# Patient Record
Sex: Male | Born: 1960 | Race: White | Hispanic: No | Marital: Married
Health system: Southern US, Community
[De-identification: ages and names within clinical notes are randomized; demographics above are authoritative.]

---

## 1998-03-15 ENCOUNTER — Ambulatory Visit (HOSPITAL_COMMUNITY): Admission: RE | Admit: 1998-03-15 | Discharge: 1998-03-15 | Payer: Self-pay | Admitting: Gynecology

## 2006-06-29 ENCOUNTER — Encounter: Admission: RE | Admit: 2006-06-29 | Discharge: 2006-06-29 | Payer: Self-pay | Admitting: Family Medicine

## 2006-08-24 ENCOUNTER — Encounter: Admission: RE | Admit: 2006-08-24 | Discharge: 2006-08-24 | Payer: Self-pay | Admitting: Family Medicine

## 2009-06-08 IMAGING — CT CT EXTREM LOW W/O CM*R*
2 of 4 series · 5 of 14 positions shown, 6 images · non-contrast
Comparison: CT scan 06/29/06.

CLINICAL DATA: Known navicular fracture. Question healing.
CT OF THE RIGHT ANKLE WITHOUT CONTRAST ? 08/24/06:
TECHNIQUE: Multidetector CT imaging was performed of the right ankle according to standard protocol.  Multiplanar CT image reconstructions were also generated.

[Series 2: bone windows · axial · 0.30mm/px · z∈[+13,+70]mm · 3 of 93 slices shown, 4 images]
[im 24/93  soft-tissue]
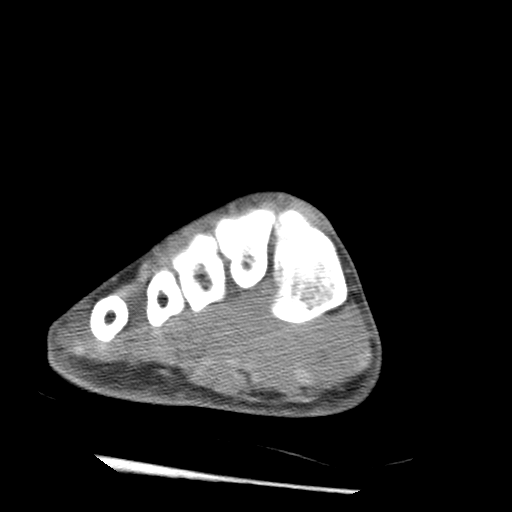
[im 24/93  bone]
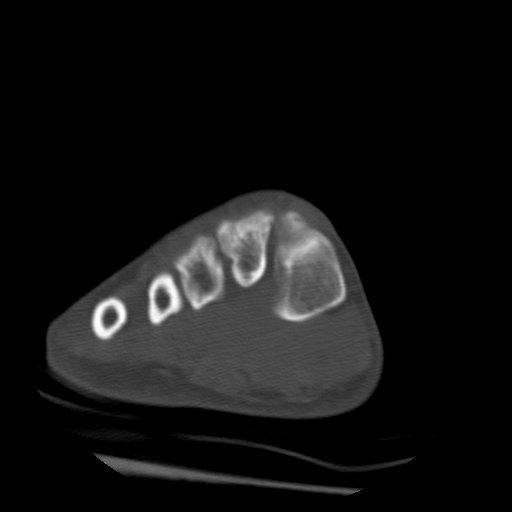
[im 47/93  bone]
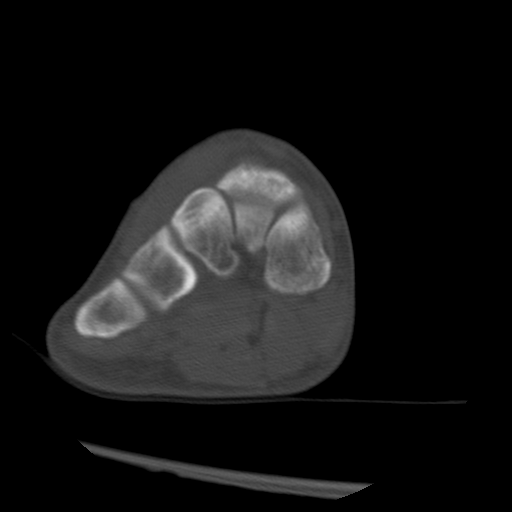
[im 70/93  bone]
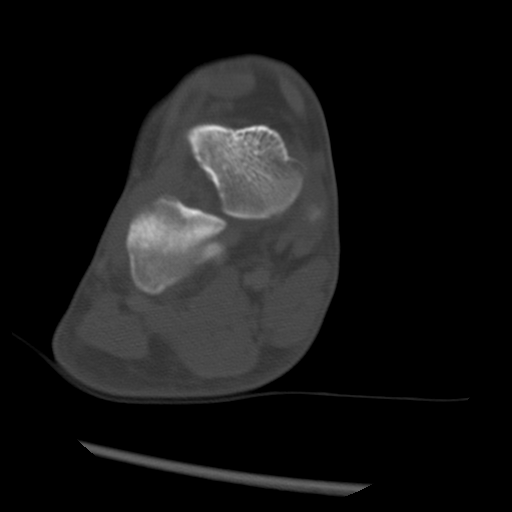

[Series 3: detail windows · axial · 0.30mm/px · z∈[+22,+60]mm · 2 of 93 slices shown]
[im 31/93  bone]
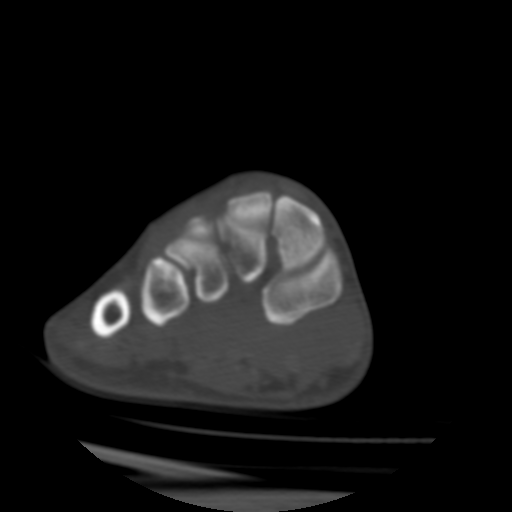
[im 62/93  bone]
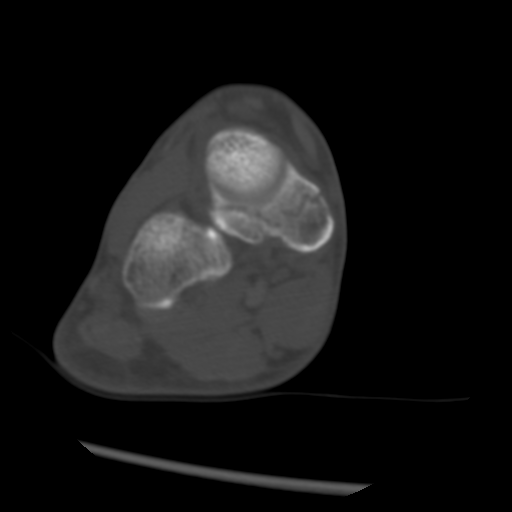

[5 of 14 positions shown; findings below may reference images not displayed]

FINDINGS: Again seen is navicular bone fracture.  Fracture lines are blurring with bridging bone identified centrally within the fracture.  Note is made that at the medial most margin of the fracture no definite bridging bone is seen and there may be nonunion in this location.  The remainder of the fracture appears to be healing.  An area of potential nonunion measures 0.7 cm from medial to lateral and extends along the long axis of the bone. No new fracture is identified. There has been no change in position or alignment.
IMPRESSION: Navicular bone fracture demonstrates healing.  There is a potential small area of nonunion as detailed above.

## 2017-02-24 DIAGNOSIS — M545 Low back pain: Secondary | ICD-10-CM | POA: Diagnosis not present

## 2017-03-14 DIAGNOSIS — Z0000 Encounter for general adult medical examination without abnormal findings: Secondary | ICD-10-CM | POA: Diagnosis not present

## 2017-03-14 DIAGNOSIS — Z6824 Body mass index (BMI) 24.0-24.9, adult: Secondary | ICD-10-CM | POA: Diagnosis not present

## 2017-03-14 DIAGNOSIS — Z125 Encounter for screening for malignant neoplasm of prostate: Secondary | ICD-10-CM | POA: Diagnosis not present

## 2018-04-28 DIAGNOSIS — Z0000 Encounter for general adult medical examination without abnormal findings: Secondary | ICD-10-CM | POA: Diagnosis not present

## 2018-04-28 DIAGNOSIS — Z131 Encounter for screening for diabetes mellitus: Secondary | ICD-10-CM | POA: Diagnosis not present

## 2018-04-28 DIAGNOSIS — Z125 Encounter for screening for malignant neoplasm of prostate: Secondary | ICD-10-CM | POA: Diagnosis not present

## 2019-04-18 ENCOUNTER — Ambulatory Visit: Payer: BC Managed Care – PPO | Attending: Internal Medicine

## 2019-04-18 DIAGNOSIS — Z23 Encounter for immunization: Secondary | ICD-10-CM

## 2019-04-18 NOTE — Progress Notes (Signed)
   Covid-[facility] Vaccination [facility]  Name:  Cory Patterson    MRN: [id number] DOB: 27-Feb-1960  04/18/2019  [name] was observed post Covid-19 immunization for 15 minutes without incident. He was provided with Vaccine Information Sheet and instruction to access the V-Safe system.   [name] was instructed to call 911 with any severe reactions post vaccine: Marland Kitchen Difficulty breathing  . Swelling of face and throat  . A fast heartbeat  . A bad rash all over body  . Dizziness and weakness   Immunizations Administered    Name Date Dose VIS Date Route   Pfizer COVID-19 Vaccine 04/18/2019  9:50 AM 0.3 mL 01/16/2019 Intramuscular   Manufacturer: [employer], [employer]   Lot: [id number]   NDC: [address]-8

## 2019-05-04 DIAGNOSIS — E663 Overweight: Secondary | ICD-10-CM | POA: Diagnosis not present

## 2019-05-04 DIAGNOSIS — E785 Hyperlipidemia, unspecified: Secondary | ICD-10-CM | POA: Diagnosis not present

## 2019-05-04 DIAGNOSIS — Z0000 Encounter for general adult medical examination without abnormal findings: Secondary | ICD-10-CM | POA: Diagnosis not present

## 2019-05-04 DIAGNOSIS — Z125 Encounter for screening for malignant neoplasm of prostate: Secondary | ICD-10-CM | POA: Diagnosis not present

## 2019-05-04 DIAGNOSIS — Z131 Encounter for screening for diabetes mellitus: Secondary | ICD-10-CM | POA: Diagnosis not present

## 2019-05-13 ENCOUNTER — Ambulatory Visit: Payer: BC Managed Care – PPO | Attending: Internal Medicine

## 2019-05-13 ENCOUNTER — Ambulatory Visit: Payer: BC Managed Care – PPO

## 2019-05-13 DIAGNOSIS — Z23 Encounter for immunization: Secondary | ICD-10-CM

## 2019-05-13 NOTE — Progress Notes (Signed)
   Covid-19 Vaccination Clinic  Name:  Cory Patterson    MRN: [id number] DOB: 04/26/1960  05/13/2019  [name] was observed post Covid-19 immunization for 15 minutes without incident. He was provided with Vaccine Information Sheet and instruction to access the V-Safe system.   [name] was instructed to call 911 with any severe reactions post vaccine: Marland Kitchen Difficulty breathing  . Swelling of face and throat  . A fast heartbeat  . A bad rash all over body  . Dizziness and weakness   Immunizations Administered    Name Date Dose VIS Date Route   Pfizer COVID-19 Vaccine 05/13/2019  2:35 PM 0.3 mL 01/16/2019 Intramuscular   Manufacturer: [employer], [employer]   Lot: [id number]   NDC: [address]-2

## 2020-04-04 DIAGNOSIS — M79671 Pain in right foot: Secondary | ICD-10-CM | POA: Diagnosis not present

## 2020-05-05 DIAGNOSIS — Z13220 Encounter for screening for lipoid disorders: Secondary | ICD-10-CM | POA: Diagnosis not present

## 2020-05-05 DIAGNOSIS — E663 Overweight: Secondary | ICD-10-CM | POA: Diagnosis not present

## 2020-05-05 DIAGNOSIS — Z0000 Encounter for general adult medical examination without abnormal findings: Secondary | ICD-10-CM | POA: Diagnosis not present

## 2020-05-05 DIAGNOSIS — Z131 Encounter for screening for diabetes mellitus: Secondary | ICD-10-CM | POA: Diagnosis not present

## 2020-05-05 DIAGNOSIS — Z125 Encounter for screening for malignant neoplasm of prostate: Secondary | ICD-10-CM | POA: Diagnosis not present

## 2020-09-30 DIAGNOSIS — Z86010 Personal history of colonic polyps: Secondary | ICD-10-CM | POA: Diagnosis not present

## 2021-01-06 DIAGNOSIS — Z86010 Personal history of colonic polyps: Secondary | ICD-10-CM | POA: Diagnosis not present

## 2021-01-06 DIAGNOSIS — K5730 Diverticulosis of large intestine without perforation or abscess without bleeding: Secondary | ICD-10-CM | POA: Diagnosis not present

## 2021-06-09 DIAGNOSIS — Z125 Encounter for screening for malignant neoplasm of prostate: Secondary | ICD-10-CM | POA: Diagnosis not present

## 2021-06-09 DIAGNOSIS — Z13220 Encounter for screening for lipoid disorders: Secondary | ICD-10-CM | POA: Diagnosis not present

## 2021-06-09 DIAGNOSIS — N509 Disorder of male genital organs, unspecified: Secondary | ICD-10-CM | POA: Diagnosis not present

## 2021-06-09 DIAGNOSIS — Z0000 Encounter for general adult medical examination without abnormal findings: Secondary | ICD-10-CM | POA: Diagnosis not present

## 2021-06-23 ENCOUNTER — Other Ambulatory Visit: Payer: Self-pay | Admitting: Family Medicine

## 2021-06-23 DIAGNOSIS — N5089 Other specified disorders of the male genital organs: Secondary | ICD-10-CM

## 2021-07-17 ENCOUNTER — Ambulatory Visit
Admission: RE | Admit: 2021-07-17 | Discharge: 2021-07-17 | Disposition: A | Discharge status: Home / Self Care | Payer: BC Managed Care – PPO | Source: Ambulatory Visit | Attending: Family Medicine | Admitting: Family Medicine

## 2021-07-17 DIAGNOSIS — N5089 Other specified disorders of the male genital organs: Secondary | ICD-10-CM

## 2021-07-17 DIAGNOSIS — I861 Scrotal varices: Secondary | ICD-10-CM | POA: Diagnosis not present

## 2021-07-17 DIAGNOSIS — N503 Cyst of epididymis: Secondary | ICD-10-CM | POA: Diagnosis not present

## 2021-07-17 DIAGNOSIS — N442 Benign cyst of testis: Secondary | ICD-10-CM | POA: Diagnosis not present

## 2021-08-04 ENCOUNTER — Other Ambulatory Visit: Payer: Self-pay | Admitting: Family Medicine

## 2021-08-04 DIAGNOSIS — N5089 Other specified disorders of the male genital organs: Secondary | ICD-10-CM

## 2021-10-30 DIAGNOSIS — L942 Calcinosis cutis: Secondary | ICD-10-CM | POA: Diagnosis not present

## 2021-11-30 DIAGNOSIS — L942 Calcinosis cutis: Secondary | ICD-10-CM | POA: Diagnosis not present

## 2021-11-30 DIAGNOSIS — R208 Other disturbances of skin sensation: Secondary | ICD-10-CM | POA: Diagnosis not present

## 2022-04-21 DIAGNOSIS — M25562 Pain in left knee: Secondary | ICD-10-CM | POA: Diagnosis not present

## 2022-05-18 ENCOUNTER — Other Ambulatory Visit (HOSPITAL_COMMUNITY): Payer: Self-pay | Admitting: Family Medicine

## 2022-05-18 ENCOUNTER — Ambulatory Visit (HOSPITAL_COMMUNITY)
Admission: RE | Admit: 2022-05-18 | Discharge: 2022-05-18 | Disposition: A | Discharge status: Home / Self Care | Payer: BC Managed Care – PPO | Source: Ambulatory Visit | Attending: Family Medicine | Admitting: Family Medicine

## 2022-05-18 DIAGNOSIS — M79605 Pain in left leg: Secondary | ICD-10-CM | POA: Diagnosis not present

## 2022-05-18 DIAGNOSIS — M79662 Pain in left lower leg: Secondary | ICD-10-CM | POA: Diagnosis not present

## 2022-05-18 DIAGNOSIS — M25562 Pain in left knee: Secondary | ICD-10-CM | POA: Diagnosis not present

## 2022-05-18 DIAGNOSIS — M25462 Effusion, left knee: Secondary | ICD-10-CM | POA: Diagnosis not present

## 2022-05-18 NOTE — Progress Notes (Signed)
Left lower ext venous  has been completed. Refer to [facility] under chart review to view preliminary results. Results given to Sam.   05/18/2022  3:56 PM Cory Patterson, Gerarda Gunther

## 2022-07-04 DIAGNOSIS — Z0000 Encounter for general adult medical examination without abnormal findings: Secondary | ICD-10-CM | POA: Diagnosis not present

## 2022-07-04 DIAGNOSIS — R001 Bradycardia, unspecified: Secondary | ICD-10-CM | POA: Diagnosis not present

## 2022-07-04 DIAGNOSIS — E559 Vitamin D deficiency, unspecified: Secondary | ICD-10-CM | POA: Diagnosis not present

## 2022-07-04 DIAGNOSIS — Z125 Encounter for screening for malignant neoplasm of prostate: Secondary | ICD-10-CM | POA: Diagnosis not present

## 2022-07-04 DIAGNOSIS — E785 Hyperlipidemia, unspecified: Secondary | ICD-10-CM | POA: Diagnosis not present

## 2022-07-17 DIAGNOSIS — M25562 Pain in left knee: Secondary | ICD-10-CM | POA: Diagnosis not present

## 2022-07-26 DIAGNOSIS — M25562 Pain in left knee: Secondary | ICD-10-CM | POA: Diagnosis not present

## 2022-08-07 DIAGNOSIS — M25562 Pain in left knee: Secondary | ICD-10-CM | POA: Diagnosis not present

## 2023-02-26 DIAGNOSIS — J189 Pneumonia, unspecified organism: Secondary | ICD-10-CM | POA: Diagnosis not present

## 2023-03-03 DIAGNOSIS — J189 Pneumonia, unspecified organism: Secondary | ICD-10-CM | POA: Diagnosis not present

## 2024-05-01 IMAGING — US US SCROTUM W/ DOPPLER COMPLETE
1 series · 14 of 25 positions shown · non-contrast
Comparison: None Available.

CLINICAL DATA: Scrotal mass.

EXAM:
SCROTAL ULTRASOUND
DOPPLER ULTRASOUND OF THE TESTICLES
TECHNIQUE: Complete ultrasound examination of the testicles, epididymis, and
other scrotal structures was performed. Color and spectral Doppler
ultrasound were also utilized to evaluate blood flow to the
testicles.

[Series 1: us scrotum w/ doppler complete · 0.05mm/px · 14 of 62 slices shown]
[im 1/62]
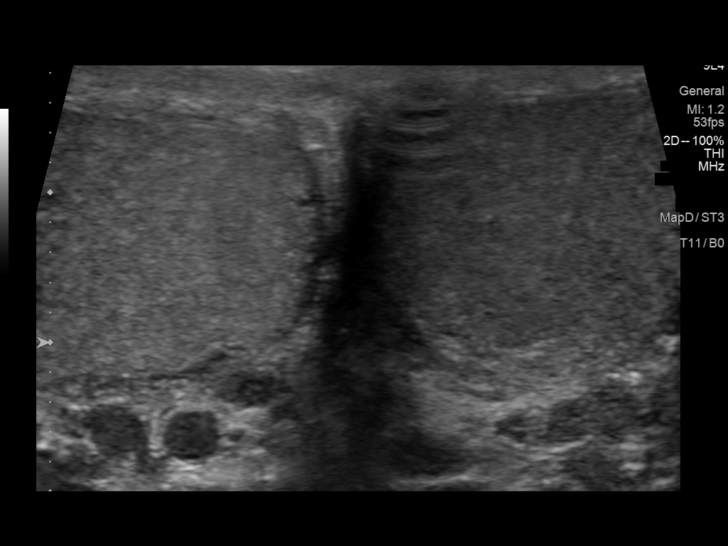
[im 6/62]
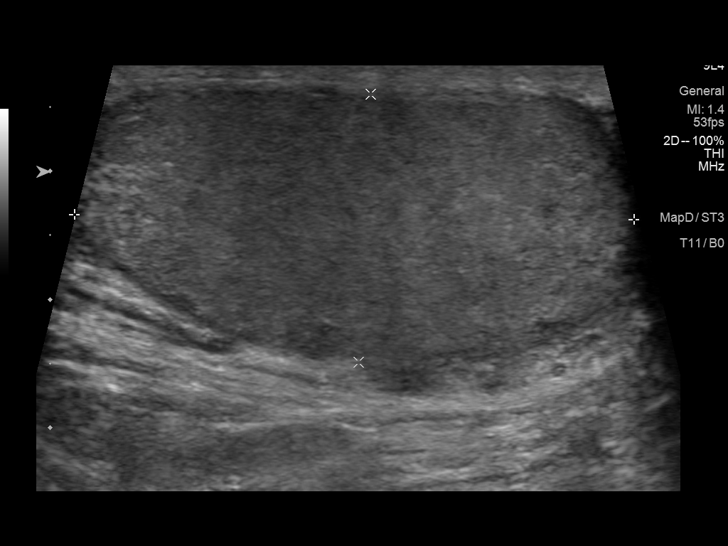
[im 11/62]
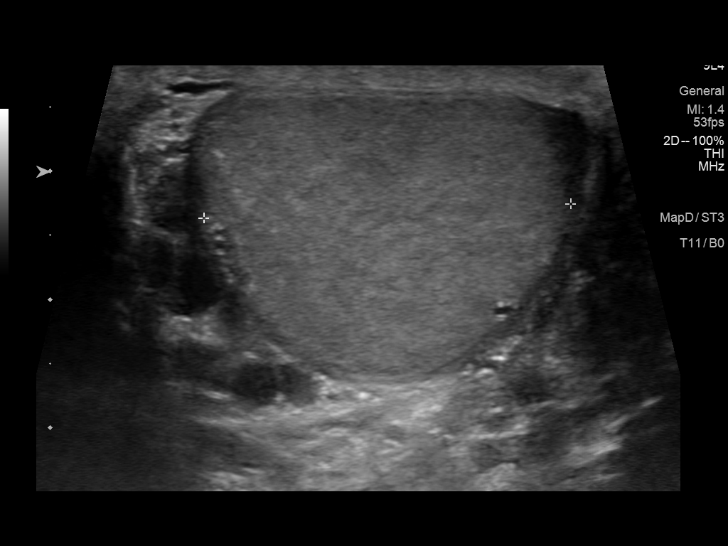
[im 16/62]
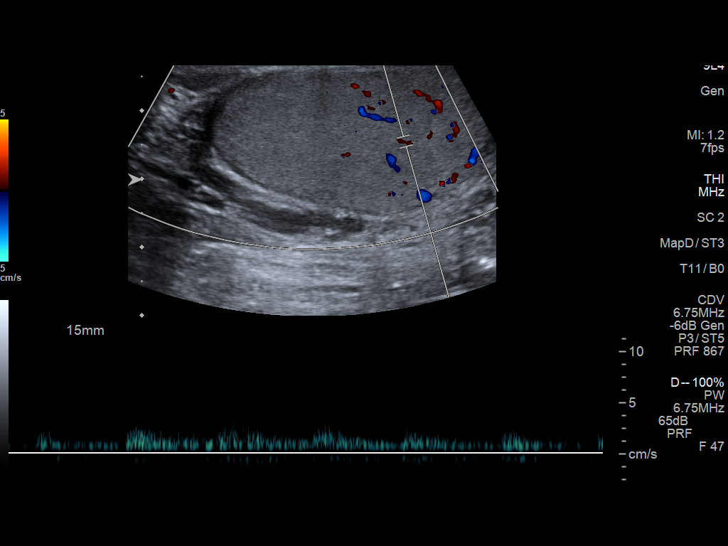
[im 21/62]
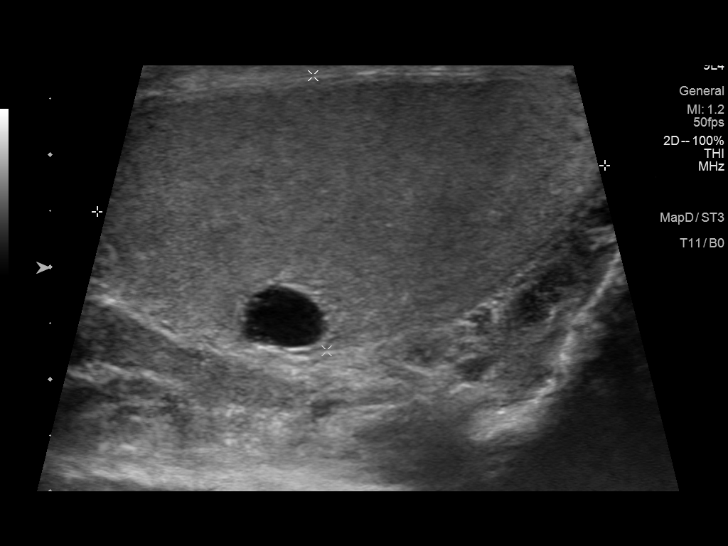
[im 23/62]
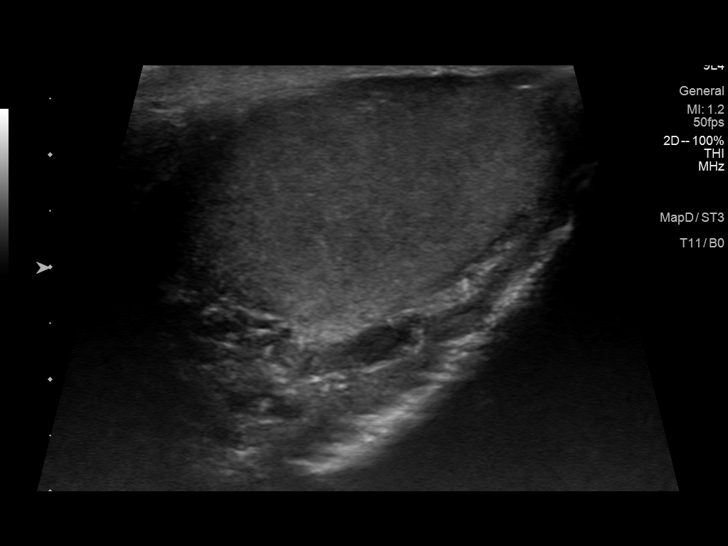
[im 28/62]
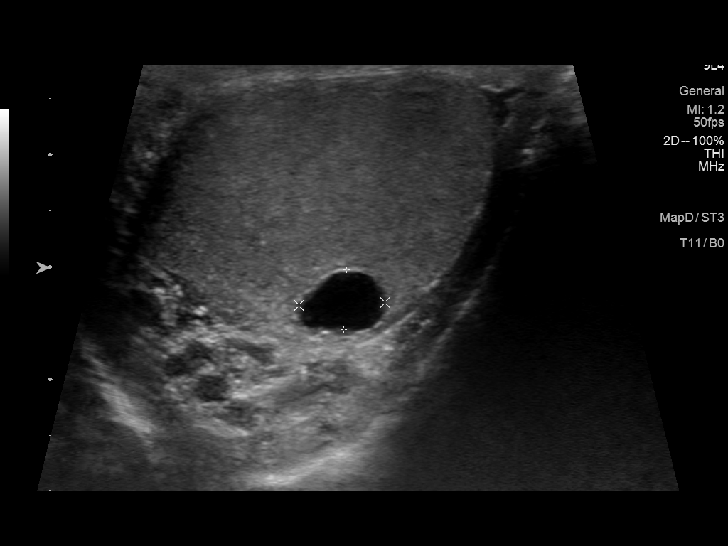
[im 34/62]
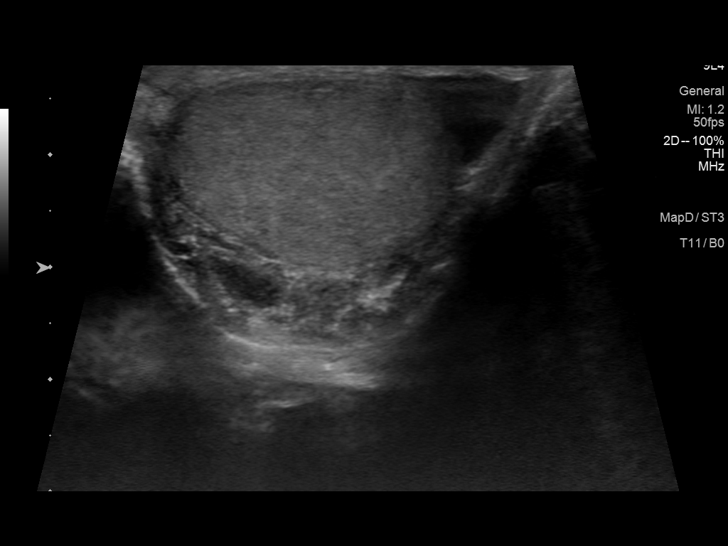
[im 39/62]
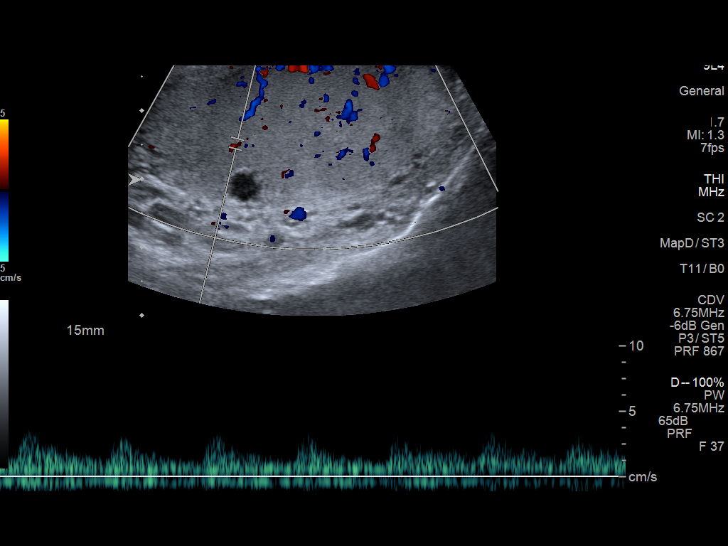
[im 41/62]
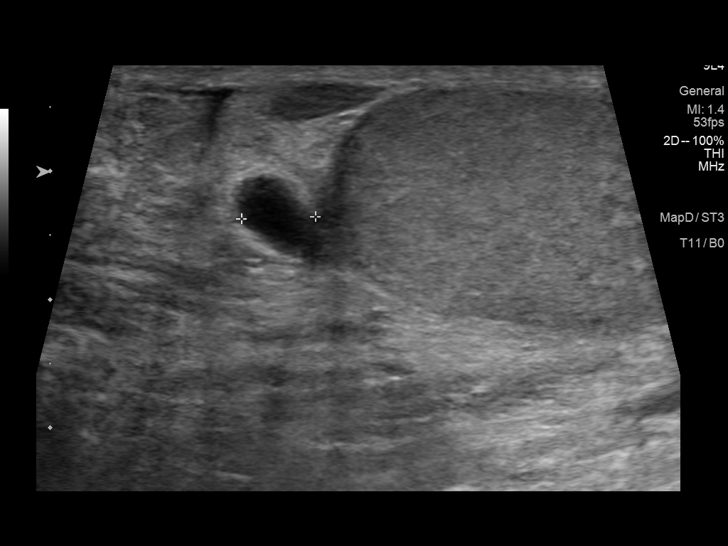
[im 46/62]
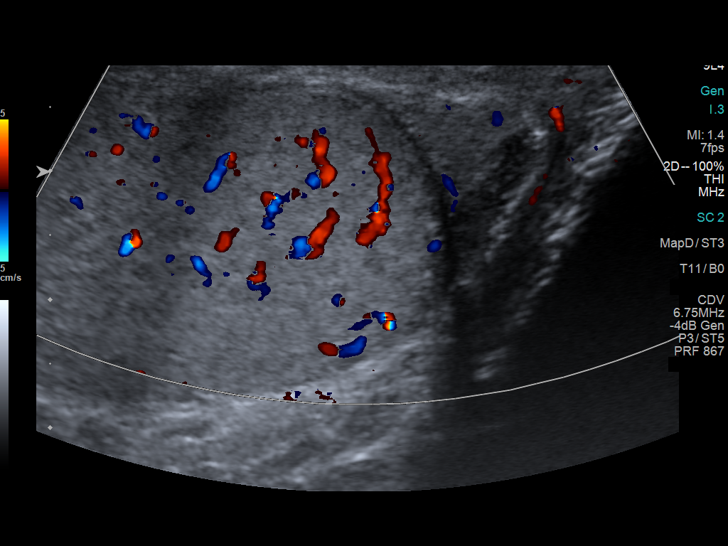
[im 51/62]
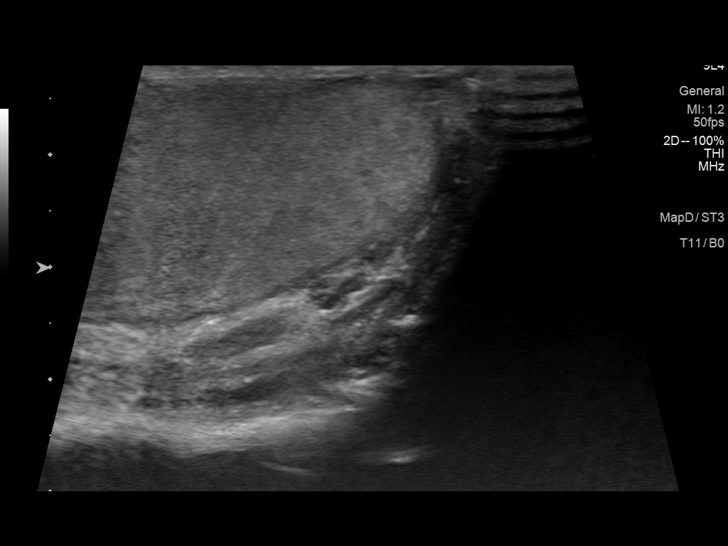
[im 56/62]
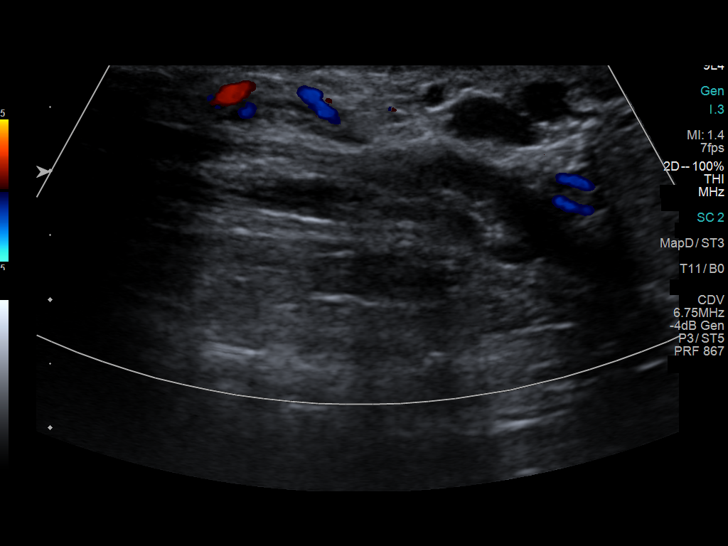
[im 62/62]
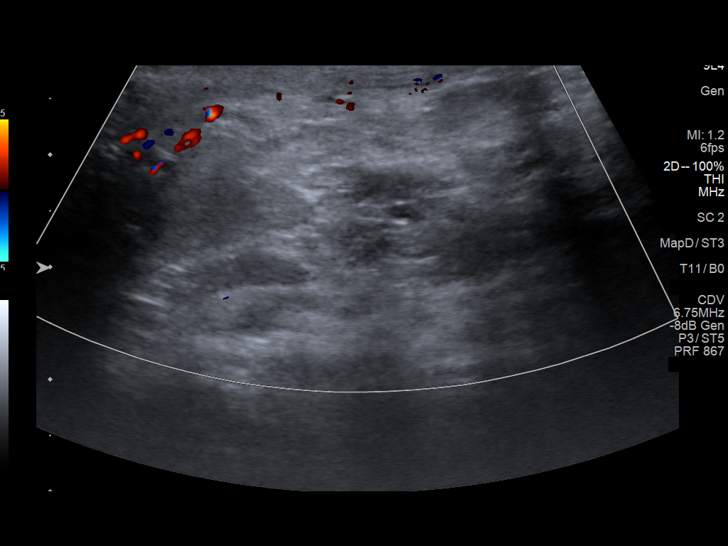

[14 of 25 positions shown; findings below may reference images not displayed]

FINDINGS: Right testicle

Measurements: 4.4 x 2.1 x 2.9 cm. No mass or microlithiasis
visualized.

Left testicle

Measurements: 4.5 x 2.5 x 2.8 cm. No microlithiasis visualized.
There is a simple anechoic cyst measuring 7 x 5 x 8 mm.

Right epididymis: Normal in size. 6 x 5 x 7 mm simple cyst
identified.

Left epididymis:  Normal in size and appearance.

Hydrocele:  None visualized.

Varicocele:  Right-sided varicocele present.

Pulsed Doppler interrogation of both testes demonstrates normal low
resistance arterial and venous waveforms bilaterally.
IMPRESSION: 1. Isolated right-sided varicocele present. Further evaluation of
the retroperitoneum with CT should be considered to exclude
obstructive process/malignancy at clinical discretion.
2. Simple left testicular cyst.
3. Simple right epididymal head cysts.
